# Patient Record
Sex: Male | Born: 1955 | Race: White | Hispanic: No | Smoking: Former smoker
Health system: Southern US, Community
[De-identification: ages and names within clinical notes are randomized; demographics above are authoritative.]

## PROBLEM LIST (undated history)

## (undated) DIAGNOSIS — M1990 Unspecified osteoarthritis, unspecified site: Secondary | ICD-10-CM

## (undated) DIAGNOSIS — M109 Gout, unspecified: Secondary | ICD-10-CM

## (undated) DIAGNOSIS — E119 Type 2 diabetes mellitus without complications: Secondary | ICD-10-CM

## (undated) DIAGNOSIS — T7840XA Allergy, unspecified, initial encounter: Secondary | ICD-10-CM

## (undated) HISTORY — PX: HERNIA REPAIR: SHX51

## (undated) HISTORY — DX: Allergy, unspecified, initial encounter: T78.40XA

## (undated) HISTORY — DX: Unspecified osteoarthritis, unspecified site: M19.90

---

## 2005-03-03 ENCOUNTER — Encounter: Admission: RE | Admit: 2005-03-03 | Discharge: 2005-03-03 | Payer: Self-pay | Admitting: Neurosurgery

## 2006-05-05 ENCOUNTER — Ambulatory Visit: Payer: Self-pay

## 2007-10-23 IMAGING — CR DG SHOULDER 3+V*R*
1 series · 4 of 4 positions shown · non-contrast
Comparison: none

REASON FOR EXAM: Pain
COMMENTS:

PROCEDURE:     DXR - DXR SHOULDER RIGHT COMPLETE  - May 05, 2006 [DATE]
RESULT:        Four views of the RIGHT shoulder show no fracture,
dislocation or other acute bony abnormality.

[Series 1: view not recorded · 0.17mm/px · 4 of 4 slices shown]
[im 1/4]
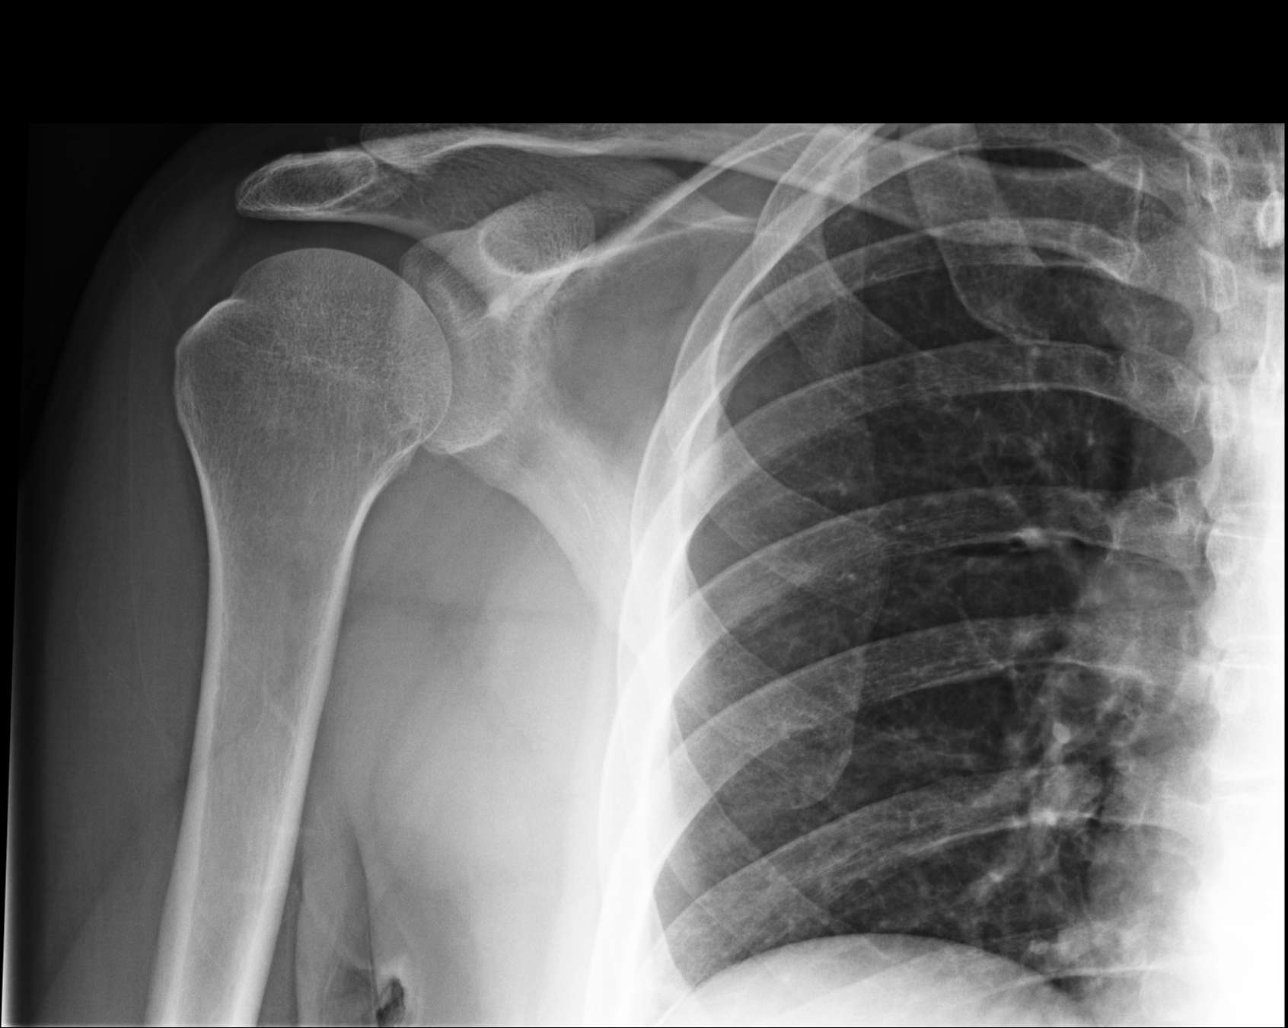
[im 2/4]
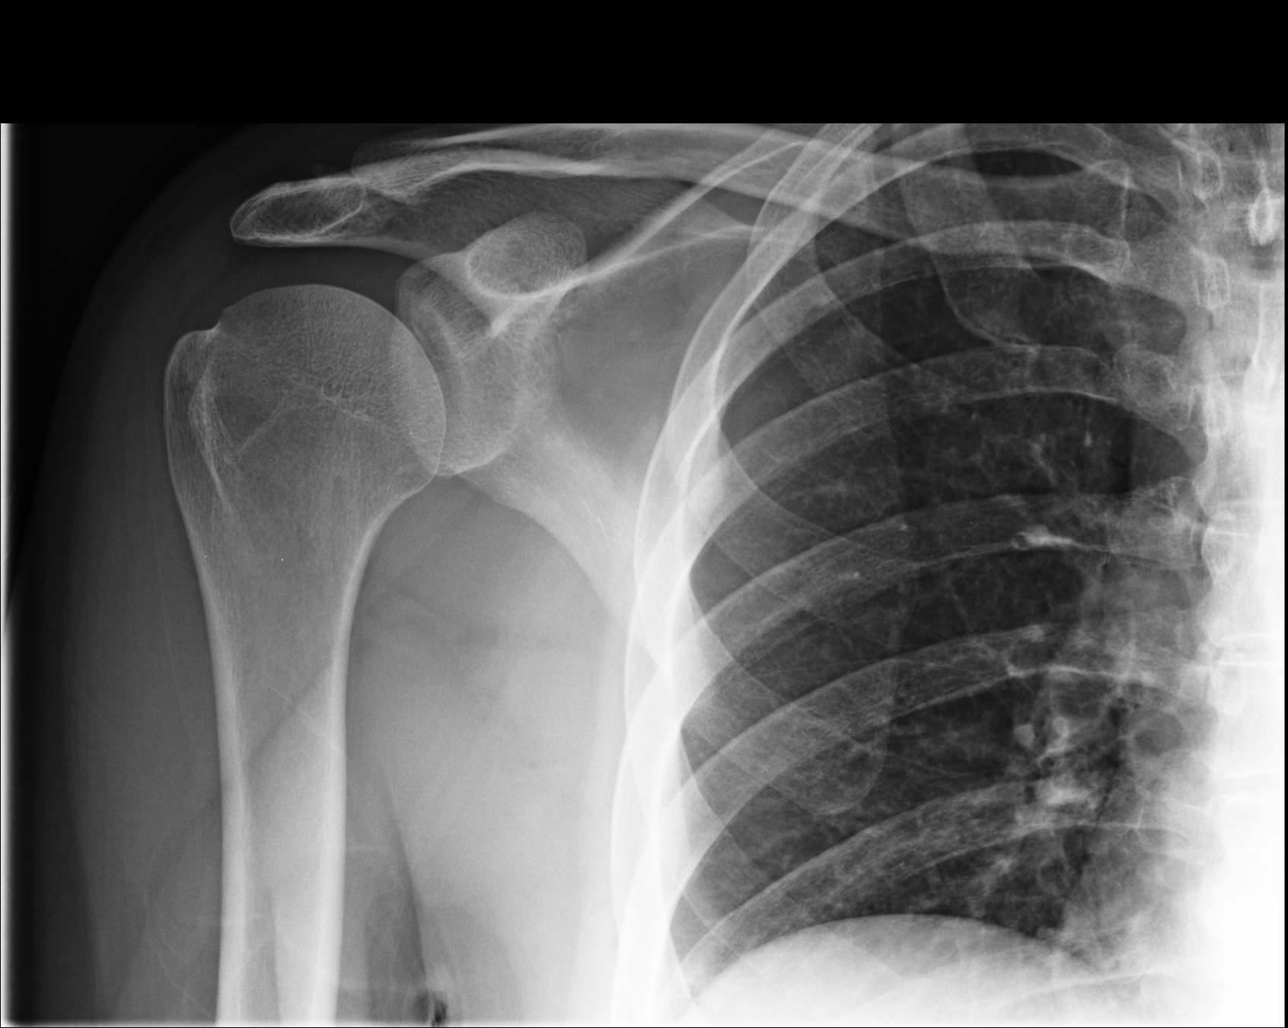
[im 3/4]
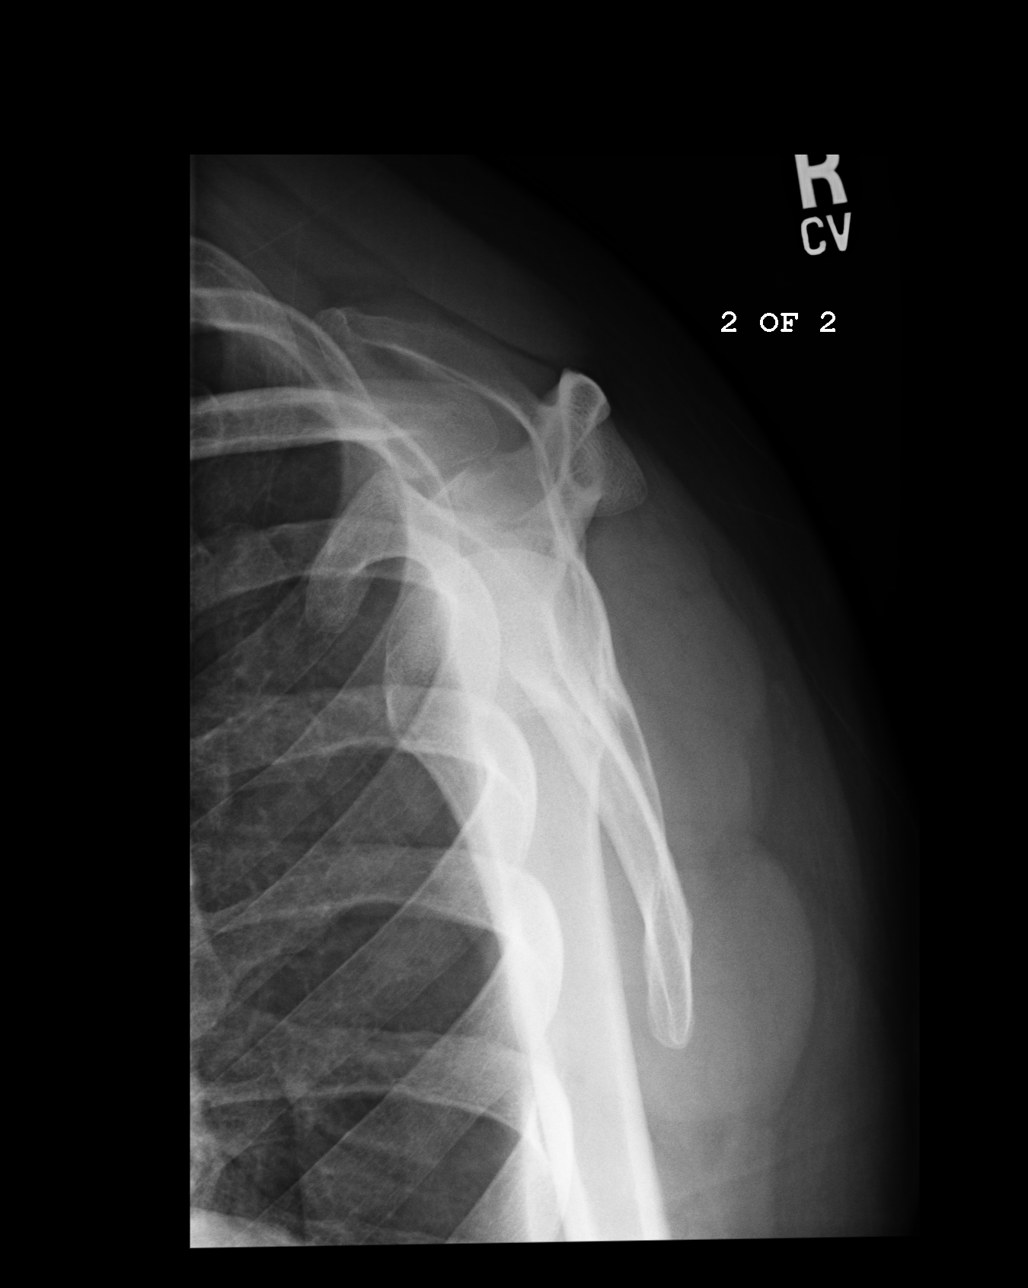
[im 4/4]
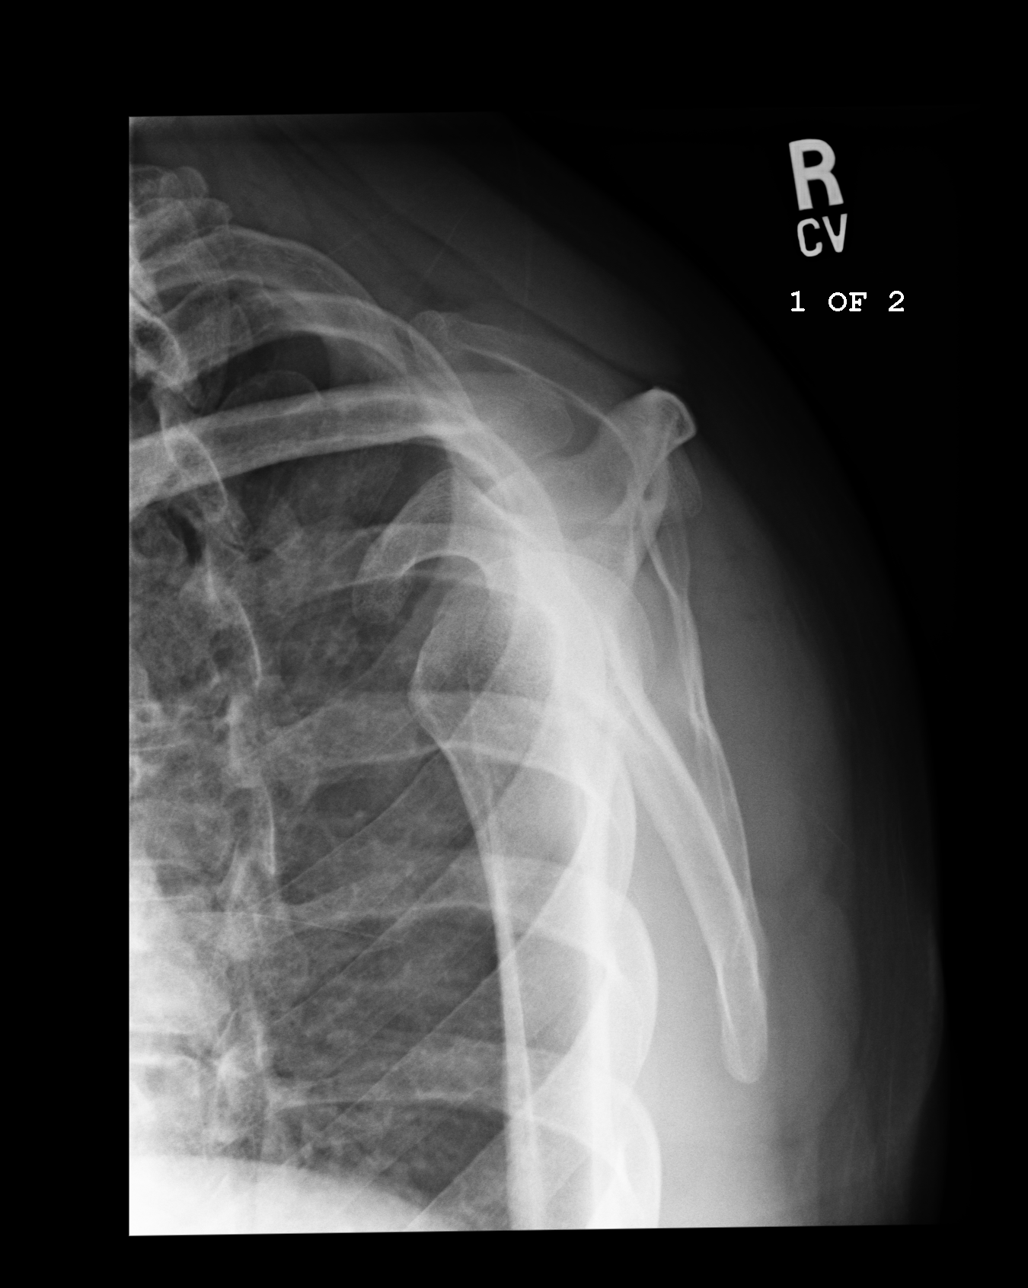

[4 of 4 positions shown; findings below may reference images not displayed]

IMPRESSION: No significant osseous abnormalities are noted.

## 2011-10-16 ENCOUNTER — Ambulatory Visit (INDEPENDENT_AMBULATORY_CARE_PROVIDER_SITE_OTHER): Payer: Medicare Other | Admitting: Family Medicine

## 2011-10-16 DIAGNOSIS — M2550 Pain in unspecified joint: Secondary | ICD-10-CM

## 2011-10-16 DIAGNOSIS — M109 Gout, unspecified: Secondary | ICD-10-CM | POA: Insufficient documentation

## 2011-10-16 MED ORDER — INDOMETHACIN ER 75 MG PO CPCR: 75.0000 mg | ORAL_CAPSULE | Freq: Two times a day (BID) | ORAL | Status: DC
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
## 2011-12-19 ENCOUNTER — Other Ambulatory Visit: Payer: Self-pay | Admitting: Family Medicine

## 2012-06-16 ENCOUNTER — Other Ambulatory Visit: Payer: Self-pay | Admitting: Physician Assistant

## 2013-06-22 ENCOUNTER — Ambulatory Visit: Payer: Medicare Other | Admitting: Family Medicine

## 2013-06-22 VITALS — BP 120/74 | HR 82 | Temp 97.8°F | Resp 18 | Ht 72.0 in | Wt 231.0 lb

## 2013-06-22 DIAGNOSIS — Z0289 Encounter for other administrative examinations: Secondary | ICD-10-CM

## 2013-06-22 DIAGNOSIS — Z0000 Encounter for general adult medical examination without abnormal findings: Secondary | ICD-10-CM
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
## 2013-06-22 NOTE — Progress Notes (Signed)
UA-spgr-1.015,protein-neg,blood-neg,glucose-neg

## 2013-12-28 ENCOUNTER — Other Ambulatory Visit: Payer: Self-pay | Admitting: Physician Assistant

## 2014-01-03 ENCOUNTER — Ambulatory Visit (INDEPENDENT_AMBULATORY_CARE_PROVIDER_SITE_OTHER): Payer: Medicare Other | Admitting: Emergency Medicine

## 2014-01-03 VITALS — BP 116/82 | HR 106 | Temp 98.1°F | Resp 16 | Ht 72.0 in | Wt 232.8 lb

## 2014-01-03 DIAGNOSIS — M109 Gout, unspecified: Secondary | ICD-10-CM

## 2014-01-03 MED ORDER — INDOMETHACIN ER 75 MG PO CPCR: 75.0000 mg | ORAL_CAPSULE | Freq: Two times a day (BID) | ORAL | Status: DC

## 2014-01-03 NOTE — Progress Notes (Signed)
   Subjective:    Patient ID: Frances NickelsGrady K Turgeon, male    DOB: 07/28/1956, 58 y.o.   MRN: [id number]  HPI 58 yo male with gout flare in right great toe.  Onset over several days.  Patient states he missed several doses of allopurinol.  Last flare 7 months ago.  States that when it happens he gets relief with Indomethecin, but is out.  PPMH:  Gout   Review of Systems  Constitutional: Negative for fever and chills.  Respiratory: Negative for choking, chest tightness and shortness of breath.   Musculoskeletal: Positive for arthralgias and joint swelling.       Objective:   Physical Exam Blood pressure 116/82, pulse 106, temperature 98.1 F (36.7 C), temperature source Oral, resp. rate 16, height 6' (1.829 m), weight 232 lb 12.8 oz (105.597 kg), SpO2 96.00%. Body mass index is 31.57 kg/(m^2). Well-developed, well nourished male who is awake, alert and oriented, in NAD. HEENT: [place]/AT, PERRL, EOMI.  Sclera and conjunctiva are clear.  Neck: supple, non-tender, no lymphadenopathy, thyromegaly. Heart: RRR, no murmur Lungs: normal effort, CTA Abdomen: normo-active bowel sounds, supple, non-tender, no mass or organomegaly. Extremities: TTP right 1st MTP joint. Skin: warm and dry without rash. Psychologic: good mood and appropriate affect, normal speech and behavior.        Assessment & Plan:  Gout  Refilled rx for indomethicin.  Patient declined cochicine.

## 2014-01-04 NOTE — Progress Notes (Signed)
Have reviewed note by Dr. McGrath and agree with his plan of care 

## 2014-01-26 ENCOUNTER — Telehealth: Payer: Self-pay

## 2014-01-26 NOTE — Telephone Encounter (Signed)
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 Did not leave call back number.

## 2014-01-31 NOTE — Telephone Encounter (Signed)
Called patient to inform him that DOT form was ready for pick up and to also let him know to sign release form but when I called he said someone from the front office copied it for him already. There is no documentation of this and no release form scanned into Epic.

## 2014-05-17 ENCOUNTER — Other Ambulatory Visit: Payer: Self-pay | Admitting: *Deleted

## 2014-05-17 MED ORDER — INDOMETHACIN ER 75 MG PO CPCR: 75.0000 mg | ORAL_CAPSULE | Freq: Two times a day (BID) | ORAL | Status: AC

## 2014-05-25 ENCOUNTER — Telehealth: Payer: Self-pay | Admitting: *Deleted

## 2014-05-25 NOTE — Telephone Encounter (Signed)
Phoned patient and [facility] to schedule Annual Medicare Wellness Exam at either office main @ or me directly at [phone].

## 2014-06-06 ENCOUNTER — Telehealth: Payer: Self-pay | Admitting: *Deleted

## 2014-06-06 NOTE — Telephone Encounter (Signed)
Phoned & spoke with patient and states his PCP is Dr. Aida PufferJames Little in [place], [place], whom he sees for his routine healthcare needs (updated care mgt).  Further stated that his insurance company [employer]) usually calls him when it is time for his annual exams & schedules them for him.  He is going to call & schedule his appointment with Dr. Clarene DukeLittle or either call his insurance company & clarify.  Patient was not interested in scheduling anything with one of [facility] providers at [facility] at this time.

## 2023-09-23 ENCOUNTER — Telehealth: Payer: Self-pay

## 2023-09-23 NOTE — Telephone Encounter (Signed)
PT requesting call back to schedule colonoscopy [id number]

## 2023-09-24 ENCOUNTER — Other Ambulatory Visit: Payer: Self-pay

## 2023-09-24 ENCOUNTER — Telehealth: Payer: Self-pay

## 2023-09-24 DIAGNOSIS — Z1211 Encounter for screening for malignant neoplasm of colon: Secondary | ICD-10-CM

## 2023-09-24 MED ORDER — NA SULFATE-K SULFATE-MG SULF 17.5-3.13-1.6 GM/177ML PO SOLN: 1.0000 | Freq: Once | ORAL | 0 refills | Status: AC

## 2023-09-24 NOTE — Telephone Encounter (Signed)
Gastroenterology Pre-Procedure Review  Request Date: 10/09/23 Requesting Physician: Dr. Tobi Bastos  PATIENT REVIEW QUESTIONS: The patient responded to the following health history questions as indicated:    1. Are you having any GI issues? no 2. Do you have a personal history of Polyps? yes (maybe 5-6 years ago at [facility]) 3. Do you have a family history of Colon Cancer or Polyps? no 4. Diabetes Mellitus? yes (Jardiance has been advised to stop 3 days prior to procedure) 5. Joint replacements in the past 12 months?no 6. Major health problems in the past 3 months?no 7. Any artificial heart valves, MVP, or defibrillator?no    MEDICATIONS & ALLERGIES:    Patient reports the following regarding taking any anticoagulation/antiplatelet therapy:   Plavix, Coumadin, Eliquis, Xarelto, Lovenox, Pradaxa, Brilinta, or Effient? no Aspirin? no  Patient confirms/reports the following medications:  Current Outpatient Medications  Medication Sig Dispense Refill   allopurinol (ZYLOPRIM) 300 MG tablet Take 300 mg by mouth daily.     indomethacin (INDOCIN SR) 75 MG CR capsule Take 1 capsule (75 mg total) by mouth 2 (two) times daily with a meal. 30 capsule 0   No current facility-administered medications for this visit.    Patient confirms/reports the following allergies:  Allergies  Allergen Reactions   Valium Other (See Comments)    Depression   Vytorin [Ezetimibe-Simvastatin] Rash    No orders of the defined types were placed in this encounter.   AUTHORIZATION INFORMATION Primary Insurance: 1D#: Group #:  Secondary Insurance: 1D#: Group #:  SCHEDULE INFORMATION: Date: 10/09/23 Time: Location: [facility]

## 2023-10-02 ENCOUNTER — Encounter: Payer: Self-pay | Admitting: Gastroenterology

## 2023-10-06 ENCOUNTER — Telehealth: Payer: Self-pay

## 2023-10-06 NOTE — Telephone Encounter (Signed)
The patient called in because he needs his prep. He said that [facility] tried to charge him $75 and he said that he didn't pay because he get his medicine free through the [place].

## 2023-10-09 ENCOUNTER — Ambulatory Visit: Payer: No Typology Code available for payment source | Admitting: General Practice

## 2023-10-09 ENCOUNTER — Ambulatory Visit
Admission: RE | Admit: 2023-10-09 | Discharge: 2023-10-09 | Disposition: A | Discharge status: Home / Self Care | Payer: No Typology Code available for payment source | Attending: Gastroenterology | Admitting: Gastroenterology

## 2023-10-09 ENCOUNTER — Other Ambulatory Visit: Payer: Self-pay

## 2023-10-09 ENCOUNTER — Encounter
Admission: RE | Disposition: A | Discharge status: Home / Self Care | Payer: Self-pay | Source: Home / Self Care | Attending: Gastroenterology

## 2023-10-09 ENCOUNTER — Encounter: Payer: Self-pay | Admitting: Gastroenterology

## 2023-10-09 DIAGNOSIS — Z1211 Encounter for screening for malignant neoplasm of colon: Secondary | ICD-10-CM

## 2023-10-09 DIAGNOSIS — E119 Type 2 diabetes mellitus without complications: Secondary | ICD-10-CM | POA: Insufficient documentation

## 2023-10-09 DIAGNOSIS — Z860100 Personal history of colon polyps, unspecified: Secondary | ICD-10-CM | POA: Diagnosis not present

## 2023-10-09 DIAGNOSIS — Z87891 Personal history of nicotine dependence: Secondary | ICD-10-CM | POA: Insufficient documentation

## 2023-10-09 DIAGNOSIS — Z7984 Long term (current) use of oral hypoglycemic drugs: Secondary | ICD-10-CM | POA: Insufficient documentation

## 2023-10-09 HISTORY — PX: COLONOSCOPY WITH PROPOFOL: SHX5780

## 2023-10-09 LAB — GLUCOSE, CAPILLARY: Glucose-Capillary: 143 mg/dL — ABNORMAL HIGH (ref 70–99)

## 2023-10-09 SURGERY — COLONOSCOPY WITH PROPOFOL
Anesthesia: General

## 2023-10-09 MED ORDER — SODIUM CHLORIDE 0.9% FLUSH: 3.0000 mL | Freq: Two times a day (BID) | INTRAVENOUS | Status: DC

## 2023-10-09 MED ORDER — SODIUM CHLORIDE 0.9% FLUSH: 3.0000 mL | INTRAVENOUS | Status: DC | PRN

## 2023-10-09 MED ORDER — PROPOFOL 10 MG/ML IV BOLUS
INTRAVENOUS | Status: DC | PRN
  Administered 2023-10-09: 80 mg via INTRAVENOUS

## 2023-10-09 MED ORDER — SODIUM CHLORIDE 0.9 % IV SOLN: INTRAVENOUS | Status: DC | PRN

## 2023-10-09 MED ORDER — PROPOFOL 10 MG/ML IV BOLUS: INTRAVENOUS | Status: AC

## 2023-10-09 NOTE — H&P (Signed)
 Ruel Kung, MD [address], [place], [place], [address] [address], [place], [place], [address] Phone: [phone]  Fax: [phone]  Primary Care Physician:  Milissa Savant, MD   Pre-Procedure History & Physical: HPI:  Andrew Singh is a 68 y.o. male is here for an colonoscopy.   Past Medical History:  Diagnosis Date   Allergy    Arthritis     Past Surgical History:  Procedure Laterality Date   HERNIA REPAIR      Prior to Admission medications   Medication Sig Start Date End Date Taking? Authorizing Provider  allopurinol (ZYLOPRIM) 300 MG tablet Take 300 mg by mouth daily.   Yes [provider]  cholecalciferol (VITAMIN D3) 25 MCG (1000 UNIT) tablet Take 1,000 Units by mouth daily.   Yes [provider]  empagliflozin (JARDIANCE) 25 MG TABS tablet Take 25 mg by mouth daily. Taking 1/2 daily   Yes [provider]  Multiple Vitamins-Minerals (CENTRUM SILVER PO) Take by mouth.   Yes [provider]  indomethacin  (INDOCIN  SR) 75 MG CR capsule Take 1 capsule (75 mg total) by mouth 2 (two) times daily with a meal. Patient not taking: Reported on 09/24/2023 05/17/14   Harvey Seltzer, MD    Allergies as of 09/24/2023 - Review Complete 09/24/2023  Allergen Reaction Noted   Valium Other (See Comments) 10/16/2011   Vytorin [ezetimibe-simvastatin] Rash 06/22/2013    Family History  Problem Relation Age of Onset   Cancer Father    Heart disease Maternal Grandfather     Social History   Socioeconomic History   Marital status: Divorced    Spouse name: Not on file   Number of children: Not on file   Years of education: Not on file   Highest education level: Not on file  Occupational History   Not on file  Tobacco Use   Smoking status: Former   Smokeless tobacco: Not on file  Substance and Sexual Activity   Alcohol use: Not on file   Drug use: Not on file   Sexual activity: Not on file  Other Topics Concern    Not on file  Social History Narrative   Not on file   Social Drivers of Health   Financial Resource Strain: Not on file  Food Insecurity: Not on file  Transportation Needs: Not on file  Physical Activity: Not on file  Stress: Not on file  Social Connections: Not on file  Intimate Partner Violence: Not on file    Review of Systems: See HPI, otherwise negative ROS  Physical Exam: BP 114/75   Pulse 68   Temp (!) 97 F (36.1 C) (Temporal)   Resp 16   Ht 6' (1.829 m)   Wt 92.6 kg   SpO2 100%   BMI 27.69 kg/m  General:   Alert,  pleasant and cooperative in NAD Head:  Normocephalic and atraumatic. Neck:  Supple; no masses or thyromegaly. Lungs:  Clear throughout to auscultation, normal respiratory effort.    Heart:  +S1, +S2, Regular rate and rhythm, No edema. Abdomen:  Soft, nontender and nondistended. Normal bowel sounds, without guarding, and without rebound.   Neurologic:  Alert and  oriented x4;  grossly normal neurologically.  Impression/Plan: Andrew Singh is here for an colonoscopy to be performed for surveillance due to prior history of colon polyps   Risks, benefits, limitations, and alternatives regarding  colonoscopy have been reviewed with the patient.  Questions have been answered.  All  parties agreeable.   Ruel Kung, MD  10/09/2023, 7:41 AM

## 2023-10-09 NOTE — Op Note (Signed)
 [facility] Gastroenterology Patient Name: Andrew Singh Procedure Date: 10/09/2023 7:18 AM MRN: [id number] Account #: [id number] Date of Birth: 12/27/1955 Admit Type: Outpatient Age: 68 Room: [facility] ENDO ROOM 4 Gender: Male Note Status: Finalized Instrument Name: Arvis [id number] Procedure:             Colonoscopy Indications:           Surveillance: Personal history of colonic polyps                         (unknown histology) on last colonoscopy more than 3                         years ago Providers:             Ruel Kung MD, MD Referring MD:          Jayson Ill (Referring MD) Medicines:             Monitored Anesthesia Care Complications:         No immediate complications. Procedure:             Pre-Anesthesia Assessment:                        - Prior to the procedure, a History and Physical was                         performed, and patient medications, allergies and                         sensitivities were reviewed. The patient's tolerance                         of previous anesthesia was reviewed.                        - The risks and benefits of the procedure and the                         sedation options and risks were discussed with the                         patient. All questions were answered and informed                         consent was obtained.                        - ASA Grade Assessment: II - A patient with mild                         systemic disease.                        After obtaining informed consent, the colonoscope was                         passed under direct vision. Throughout the procedure,                         the patient's blood pressure, pulse,  and oxygen                         saturations were monitored continuously. The                         Colonoscope was introduced through the anus with the                         intention of advancing to the cecum. The scope was                         advanced to  the descending colon before the procedure                         was aborted. Medications were given. The colonoscopy                         was performed with ease. The patient tolerated the                         procedure well. The quality of the bowel preparation                         was inadequate. Findings:      The perianal and digital rectal examinations were normal.      A large amount of stool was found in the entire colon, interfering with       visualization. Impression:            - Preparation of the colon was inadequate.                        - Stool in the entire examined colon.                        - No specimens collected. Recommendation:        - Discharge patient to home (with escort).                        - Resume previous diet.                        - Continue present medications.                        - Repeat colonoscopy in 2 weeks because the bowel                         preparation was suboptimal. Procedure Code(s):     --- Professional ---                        [phone], 53, Colonoscopy, flexible; diagnostic,                         including collection of specimen(s) by brushing or                         washing, when performed (separate procedure) Diagnosis Code(s):     --- Professional ---  Z86.010, Personal history of colonic polyps CPT copyright 2022 American Medical Association. All rights reserved. The codes documented in this report are preliminary and upon coder review may  be revised to meet current compliance requirements. Ruel Kung, MD Ruel Kung MD, MD 10/09/2023 7:49:17 AM This report has been signed electronically. Number of Addenda: 0 Note Initiated On: 10/09/2023 7:18 AM Total Procedure Duration: 0 hours 1 minute 48 seconds  Estimated Blood Loss:  Estimated blood loss: none.      [facility]

## 2023-10-09 NOTE — Anesthesia Postprocedure Evaluation (Signed)
 Anesthesia Post Note  Patient: Andrew Singh  Procedure(s) Performed: COLONOSCOPY WITH PROPOFOL   Patient location during evaluation: PACU Anesthesia Type: General Level of consciousness: awake and alert Pain management: pain level controlled Vital Signs Assessment: post-procedure vital signs reviewed and stable Respiratory status: spontaneous breathing, nonlabored ventilation, respiratory function stable and patient connected to nasal cannula oxygen Cardiovascular status: blood pressure returned to baseline and stable Postop Assessment: no apparent nausea or vomiting Anesthetic complications: no  No notable events documented.   Last Vitals:  Vitals:   10/09/23 0752 10/09/23 0805  BP: (!) 97/53 114/87  Pulse: 64 62  Resp: 17 10  Temp: (!) 35.6 C   SpO2: 98% 97%    Last Pain:  Vitals:   10/09/23 0805  TempSrc:   PainSc: 0-No pain                 Debby Mines

## 2023-10-09 NOTE — Anesthesia Preprocedure Evaluation (Signed)
 Anesthesia Evaluation  Patient identified by MRN, date of birth, ID band Patient awake    Reviewed: Allergy & Precautions, NPO status , Patient's Chart, lab work & pertinent test results  Airway Mallampati: III  TM Distance: >3 FB Neck ROM: full    Dental  (+) Chipped   Pulmonary neg pulmonary ROS, former smoker   Pulmonary exam normal        Cardiovascular negative cardio ROS Normal cardiovascular exam     Neuro/Psych negative neurological ROS  negative psych ROS   GI/Hepatic negative GI ROS, Neg liver ROS,,,  Endo/Other  diabetes    Renal/GU negative Renal ROS  negative genitourinary   Musculoskeletal   Abdominal   Peds  Hematology negative hematology ROS (+)   Anesthesia Other Findings Past Medical History: No date: Allergy No date: Arthritis  Past Surgical History: No date: HERNIA REPAIR  BMI    Body Mass Index: 27.69 kg/m      Reproductive/Obstetrics negative OB ROS                             Anesthesia Physical Anesthesia Plan  ASA: 2  Anesthesia Plan: General   Post-op Pain Management: Minimal or no pain anticipated   Induction: Intravenous  PONV Risk Score and Plan: 3 and Propofol  infusion, TIVA and Ondansetron  Airway Management Planned: Nasal Cannula  Additional Equipment: None  Intra-op Plan:   Post-operative Plan:   Informed Consent: I have reviewed the patients History and Physical, chart, labs and discussed the procedure including the risks, benefits and alternatives for the proposed anesthesia with the patient or authorized representative who has indicated his/her understanding and acceptance.     Dental advisory given  Plan Discussed with: CRNA and Surgeon  Anesthesia Plan Comments: (Discussed risks of anesthesia with patient, including possibility of difficulty with spontaneous ventilation under anesthesia necessitating airway intervention,  PONV, and rare risks such as cardiac or respiratory or neurological events, and allergic reactions. Discussed the role of CRNA in patient's perioperative care. Patient understands.)       Anesthesia Quick Evaluation

## 2023-10-09 NOTE — Transfer of Care (Signed)
 Immediate Anesthesia Transfer of Care Note  Patient: [name]  Procedure(s) Performed: COLONOSCOPY WITH PROPOFOL   Patient Location: PACU  Anesthesia Type:MAC  Level of Consciousness: awake  Airway & Oxygen Therapy: Patient Spontanous Breathing  Post-op Assessment: Report given to RN and Post -op Vital signs reviewed and stable  Post vital signs: Reviewed and stable  Last Vitals:  Vitals Value Taken Time  BP 97/53 10/09/23 0753  Temp 35.6 C 10/09/23 0752  Pulse 66 10/09/23 0754  Resp 15 10/09/23 0754  SpO2 98 % 10/09/23 0754  Vitals shown include unfiled device data.  Last Pain:  Vitals:   10/09/23 0752  TempSrc: Temporal  PainSc: 0-No pain      Patients Stated Pain Goal: 0 (10/09/23 0703)  Complications: No notable events documented.

## 2023-10-12 ENCOUNTER — Encounter: Payer: Self-pay | Admitting: Gastroenterology

## 2023-10-13 ENCOUNTER — Telehealth: Payer: Self-pay

## 2023-10-13 NOTE — Telephone Encounter (Signed)
[facility] is requesting [place] to fax them the colonoscopy report and path report. Printed both reports and faxed them to [phone]

## 2023-10-14 NOTE — Telephone Encounter (Signed)
Andrew Singh stated that he is to wait for the [facility] to approve another colonoscopy. Patient was also upset because he did not receive Sutabs in addition to the liquid prep. Patient stated that he was going to wait for the [facility] before he calls and reschedule a colonoscopy.

## 2024-03-10 ENCOUNTER — Telehealth: Payer: Self-pay

## 2024-03-10 NOTE — Telephone Encounter (Signed)
 Pt had previous referral for colonoscopy from 10/2023, pt was to schedule a repeat colonoscopy... No one let you know as the previous referral expired in May... Can you please obtain another auth to repeat colonoscopy for the pt?

## 2024-03-15 NOTE — Telephone Encounter (Signed)
 Received a call from the [facility] department requesting a new request for the colonoscopy that needs repeating. Stated you could put TBD on request. Please fax or email to:  Fax: [phone] or Email: [email or web address]  They are waiting on the request for approval.

## 2024-03-17 ENCOUNTER — Other Ambulatory Visit: Payer: Self-pay

## 2024-03-17 DIAGNOSIS — G4733 Obstructive sleep apnea (adult) (pediatric): Secondary | ICD-10-CM | POA: Insufficient documentation

## 2024-03-17 DIAGNOSIS — E559 Vitamin D deficiency, unspecified: Secondary | ICD-10-CM | POA: Insufficient documentation

## 2024-03-17 DIAGNOSIS — L659 Nonscarring hair loss, unspecified: Secondary | ICD-10-CM | POA: Insufficient documentation

## 2024-03-17 DIAGNOSIS — F419 Anxiety disorder, unspecified: Secondary | ICD-10-CM | POA: Insufficient documentation

## 2024-03-17 DIAGNOSIS — K219 Gastro-esophageal reflux disease without esophagitis: Secondary | ICD-10-CM | POA: Insufficient documentation

## 2024-03-17 DIAGNOSIS — E119 Type 2 diabetes mellitus without complications: Secondary | ICD-10-CM | POA: Insufficient documentation

## 2024-03-17 DIAGNOSIS — Z8739 Personal history of other diseases of the musculoskeletal system and connective tissue: Secondary | ICD-10-CM | POA: Insufficient documentation

## 2024-03-17 DIAGNOSIS — F32A Depression, unspecified: Secondary | ICD-10-CM | POA: Insufficient documentation

## 2024-03-24 ENCOUNTER — Other Ambulatory Visit: Payer: Self-pay

## 2024-03-24 ENCOUNTER — Telehealth: Payer: Self-pay

## 2024-03-24 DIAGNOSIS — Z1211 Encounter for screening for malignant neoplasm of colon: Secondary | ICD-10-CM

## 2024-03-24 DIAGNOSIS — Z860100 Personal history of colon polyps, unspecified: Secondary | ICD-10-CM

## 2024-03-24 MED ORDER — PEG 3350-KCL-NA BICARB-NACL 420 G PO SOLR: 4000.0000 mL | ORAL | 0 refills | Status: AC

## 2024-03-24 NOTE — Telephone Encounter (Signed)
 Pt lmovm requesting call back to schedule repeat colonoscopy

## 2024-03-24 NOTE — Telephone Encounter (Signed)
 Gastroenterology Pre-Procedure Review  Request Date: 06/21/24 Requesting Physician: Dr. Jinny  PATIENT REVIEW QUESTIONS: The patient responded to the following health history questions as indicated:    1. Are you having any GI issues? no 2. Do you have a personal history of Polyps? yes (over 5 years ago [facility], previous colonoscopy attempted 10/09/23 but poor prep.  Prep changed from Suprep to (2) days Nulytely ) 3. Do you have a family history of Colon Cancer or Polyps? no 4. Diabetes Mellitus? yes (pt takes Jardiance and has been advised to stop 3 days prior to procedure and if he has any concerns regarding his blood sugars to contact PCP) 5. Joint replacements in the past 12 months?no 6. Major health problems in the past 3 months?no 7. Any artificial heart valves, MVP, or defibrillator?no    MEDICATIONS & ALLERGIES:    Patient reports the following regarding taking any anticoagulation/antiplatelet therapy:   Plavix, Coumadin, Eliquis, Xarelto, Lovenox, Pradaxa, Brilinta, or Effient? no Aspirin? no  Patient confirms/reports the following medications:  Current Outpatient Medications  Medication Sig Dispense Refill   allopurinol (ZYLOPRIM) 300 MG tablet Take 300 mg by mouth daily.     cholecalciferol (VITAMIN D3) 25 MCG (1000 UNIT) tablet Take 1,000 Units by mouth daily.     empagliflozin (JARDIANCE) 25 MG TABS tablet Take 25 mg by mouth daily. Taking 1/2 daily     polyethylene glycol-electrolytes (NULYTELY) 420 g solution Take 4,000 mLs by mouth 1 day or 1 dose for 2 days. 8000 mL 0   atorvastatin (LIPITOR) 20 MG tablet Take 20 mg by mouth daily. (Patient not taking: Reported on 03/24/2024)     indomethacin  (INDOCIN  SR) 75 MG CR capsule Take 1 capsule (75 mg total) by mouth 2 (two) times daily with a meal. (Patient not taking: Reported on 09/24/2023) 30 capsule 0   Multiple Vitamins-Minerals (CENTRUM SILVER PO) Take by mouth.     No current facility-administered medications for this  visit.    Patient confirms/reports the following allergies:  Allergies  Allergen Reactions   Valium Other (See Comments)    Depression   Vytorin [Ezetimibe-Simvastatin] Rash    No orders of the defined types were placed in this encounter.   AUTHORIZATION INFORMATION Primary Insurance: 1D#: Group #:  Secondary Insurance: 1D#: Group #:  SCHEDULE INFORMATION: Date: 06/21/24 Time: Location: [facility]

## 2024-06-21 ENCOUNTER — Encounter: Payer: Self-pay | Admitting: Gastroenterology

## 2024-06-21 ENCOUNTER — Ambulatory Visit: Admitting: Certified Registered"

## 2024-06-21 ENCOUNTER — Ambulatory Visit
Admission: RE | Admit: 2024-06-21 | Discharge: 2024-06-21 | Disposition: A | Discharge status: Home / Self Care | Attending: Gastroenterology | Admitting: Gastroenterology

## 2024-06-21 ENCOUNTER — Encounter
Admission: RE | Disposition: A | Discharge status: Home / Self Care | Payer: Self-pay | Source: Home / Self Care | Attending: Gastroenterology

## 2024-06-21 DIAGNOSIS — E119 Type 2 diabetes mellitus without complications: Secondary | ICD-10-CM | POA: Insufficient documentation

## 2024-06-21 DIAGNOSIS — Z87891 Personal history of nicotine dependence: Secondary | ICD-10-CM | POA: Insufficient documentation

## 2024-06-21 DIAGNOSIS — F32A Depression, unspecified: Secondary | ICD-10-CM | POA: Diagnosis not present

## 2024-06-21 DIAGNOSIS — Z1211 Encounter for screening for malignant neoplasm of colon: Secondary | ICD-10-CM | POA: Insufficient documentation

## 2024-06-21 DIAGNOSIS — F419 Anxiety disorder, unspecified: Secondary | ICD-10-CM | POA: Diagnosis not present

## 2024-06-21 DIAGNOSIS — G4730 Sleep apnea, unspecified: Secondary | ICD-10-CM | POA: Insufficient documentation

## 2024-06-21 DIAGNOSIS — Z79899 Other long term (current) drug therapy: Secondary | ICD-10-CM | POA: Diagnosis not present

## 2024-06-21 DIAGNOSIS — K219 Gastro-esophageal reflux disease without esophagitis: Secondary | ICD-10-CM | POA: Diagnosis not present

## 2024-06-21 DIAGNOSIS — Z860101 Personal history of adenomatous and serrated colon polyps: Secondary | ICD-10-CM | POA: Insufficient documentation

## 2024-06-21 DIAGNOSIS — Z860100 Personal history of colon polyps, unspecified: Secondary | ICD-10-CM

## 2024-06-21 HISTORY — DX: Gout, unspecified: M10.9

## 2024-06-21 HISTORY — PX: COLONOSCOPY: SHX5424

## 2024-06-21 HISTORY — DX: Type 2 diabetes mellitus without complications: E11.9

## 2024-06-21 LAB — GLUCOSE, CAPILLARY: Glucose-Capillary: 150 mg/dL — ABNORMAL HIGH (ref 70–99)

## 2024-06-21 SURGERY — COLONOSCOPY
Anesthesia: General

## 2024-06-21 MED ORDER — PROPOFOL 10 MG/ML IV BOLUS
INTRAVENOUS | Status: DC | PRN
  Administered 2024-06-21: 80 mg via INTRAVENOUS

## 2024-06-21 MED ORDER — PROPOFOL 1000 MG/100ML IV EMUL
INTRAVENOUS | Status: AC
  Filled 2024-06-21: qty 100

## 2024-06-21 MED ORDER — PROPOFOL 500 MG/50ML IV EMUL
INTRAVENOUS | Status: DC | PRN
  Administered 2024-06-21: 125 ug/kg/min via INTRAVENOUS

## 2024-06-21 MED ORDER — PHENYLEPHRINE 80 MCG/ML (10ML) SYRINGE FOR IV PUSH (FOR BLOOD PRESSURE SUPPORT)
PREFILLED_SYRINGE | INTRAVENOUS | Status: DC | PRN
  Administered 2024-06-21 (×2): 80 ug via INTRAVENOUS

## 2024-06-21 MED ORDER — SODIUM CHLORIDE 0.9 % IV SOLN: INTRAVENOUS | Status: DC

## 2024-06-21 MED ORDER — LIDOCAINE HCL (CARDIAC) PF 100 MG/5ML IV SOSY
PREFILLED_SYRINGE | INTRAVENOUS | Status: DC | PRN
  Administered 2024-06-21: 100 mg via INTRAVENOUS

## 2024-06-21 NOTE — H&P (Signed)
 Rogelia Copping, MD [facility] [address], [address] [place], [place] [address] [phone] Fax : [phone]  Primary Care Physician:  Milissa Savant, MD Primary Gastroenterologist:  Dr. Copping  Pre-Procedure History & Physical: HPI:  Andrew Singh is a 68 y.o. male is here for an colonoscopy.   Past Medical History:  Diagnosis Date   Allergy    Arthritis    Diabetes mellitus without complication (HCC)    Gout     Past Surgical History:  Procedure Laterality Date   COLONOSCOPY WITH PROPOFOL  N/A 10/09/2023   Procedure: COLONOSCOPY WITH PROPOFOL ;  Surgeon: Therisa Bi, MD;  Location: [facility] ENDOSCOPY;  Service: Gastroenterology;  Laterality: N/A;   HERNIA REPAIR      Prior to Admission medications   Medication Sig Start Date End Date Taking? Authorizing Provider  allopurinol (ZYLOPRIM) 300 MG tablet Take 300 mg by mouth daily.   Yes [provider]  atorvastatin (LIPITOR) 20 MG tablet Take 20 mg by mouth daily. Patient not taking: Reported on 03/24/2024 09/10/23   [provider]  cholecalciferol (VITAMIN D3) 25 MCG (1000 UNIT) tablet Take 1,000 Units by mouth daily.    [provider]  empagliflozin (JARDIANCE) 25 MG TABS tablet Take 25 mg by mouth daily. Taking 1/2 daily    [provider]  indomethacin  (INDOCIN  SR) 75 MG CR capsule Take 1 capsule (75 mg total) by mouth 2 (two) times daily with a meal. Patient not taking: Reported on 09/24/2023 05/17/14   Harvey Seltzer, MD  Multiple Vitamins-Minerals (CENTRUM SILVER PO) Take by mouth.    [provider]    Allergies as of 03/24/2024 - Review Complete 03/24/2024  Allergen Reaction Noted   Valium Other (See Comments) 10/16/2011   Vytorin [ezetimibe-simvastatin] Rash 06/22/2013    Family History  Problem Relation Age of Onset   Cancer Father    Heart disease Maternal Grandfather     Social History   Socioeconomic History   Marital status: Divorced    Spouse name: Not on file    Number of children: Not on file   Years of education: Not on file   Highest education level: Not on file  Occupational History   Not on file  Tobacco Use   Smoking status: Former   Smokeless tobacco: Not on file  Vaping Use   Vaping status: Never Used  Substance and Sexual Activity   Alcohol use: Yes    Comment: rarely   Drug use: Not Currently   Sexual activity: Not on file  Other Topics Concern   Not on file  Social History Narrative   Not on file   Social Drivers of Health   Financial Resource Strain: Not on file  Food Insecurity: Not on file  Transportation Needs: Not on file  Physical Activity: Not on file  Stress: Not on file  Social Connections: Not on file  Intimate Partner Violence: Not on file    Review of Systems: See HPI, otherwise negative ROS  Physical Exam: BP (!) 146/90   Pulse 65   Temp (!) 97 F (36.1 C) (Temporal)   Resp 20   Ht 6' (1.829 m)   Wt 95.3 kg   SpO2 99%   BMI 28.48 kg/m  General:   Alert,  pleasant and cooperative in NAD Head:  Normocephalic and atraumatic. Neck:  Supple; no masses or thyromegaly. Lungs:  Clear throughout to auscultation.    Heart:  Regular rate and rhythm. Abdomen:  Soft, nontender and nondistended. Normal  bowel sounds, without guarding, and without rebound.   Neurologic:  Alert and  oriented x4;  grossly normal neurologically.  Impression/Plan: Andrew Singh is here for an colonoscopy to be performed for a history of adenomatous polyps on 2025   Risks, benefits, limitations, and alternatives regarding  colonoscopy have been reviewed with the patient.  Questions have been answered.  All parties agreeable.   Rogelia Copping, MD  06/21/2024, 9:59 AM

## 2024-06-21 NOTE — Transfer of Care (Signed)
 Immediate Anesthesia Transfer of Care Note  Patient: Andrew Singh  Procedure(s) Performed: COLONOSCOPY  Patient Location: PACU  Anesthesia Type:MAC  Level of Consciousness: awake, alert , and oriented  Airway & Oxygen Therapy: Patient Spontanous Breathing  Post-op Assessment: Report given to RN and Post -op Vital signs reviewed and stable  Post vital signs: stable  Last Vitals:  Vitals Value Taken Time  BP 92/49 06/21/24 10:43  Temp    Pulse 75 06/21/24 10:43  Resp 12 06/21/24 10:43  SpO2 98 % 06/21/24 10:43  Vitals shown include unfiled device data.  Last Pain:  Vitals:   06/21/24 0944  TempSrc: Temporal  PainSc: 0-No pain         Complications: No notable events documented.

## 2024-06-21 NOTE — Anesthesia Preprocedure Evaluation (Signed)
 Anesthesia Evaluation  Patient identified by MRN, date of birth, ID band Patient awake    Reviewed: Allergy & Precautions, H&P , NPO status , Patient's Chart, lab work & pertinent test results, reviewed documented beta blocker date and time   Airway Mallampati: II   Neck ROM: full    Dental  (+) Poor Dentition   Pulmonary neg pulmonary ROS, sleep apnea , former smoker   Pulmonary exam normal        Cardiovascular negative cardio ROS Normal cardiovascular exam Rhythm:regular Rate:Normal     Neuro/Psych  PSYCHIATRIC DISORDERS Anxiety Depression    negative neurological ROS  negative psych ROS   GI/Hepatic negative GI ROS, Neg liver ROS,GERD  ,,  Endo/Other  negative endocrine ROSdiabetes    Renal/GU negative Renal ROS  negative genitourinary   Musculoskeletal  (+) Arthritis ,    Abdominal   Peds  Hematology negative hematology ROS (+)   Anesthesia Other Findings Past Medical History: No date: Allergy No date: Arthritis No date: Diabetes mellitus without complication (HCC) No date: Gout Past Surgical History: 10/09/2023: COLONOSCOPY WITH PROPOFOL ; N/A     Comment:  Procedure: COLONOSCOPY WITH PROPOFOL ;  Surgeon: Therisa Bi, MD;  Location: [facility] ENDOSCOPY;  Service:               Gastroenterology;  Laterality: N/A; No date: HERNIA REPAIR BMI    Body Mass Index: 28.48 kg/m     Reproductive/Obstetrics negative OB ROS                              Anesthesia Physical Anesthesia Plan  ASA: 2  Anesthesia Plan: General   Post-op Pain Management:    Induction:   PONV Risk Score and Plan:   Airway Management Planned:   Additional Equipment:   Intra-op Plan:   Post-operative Plan:   Informed Consent: I have reviewed the patients History and Physical, chart, labs and discussed the procedure including the risks, benefits and alternatives for the proposed anesthesia  with the patient or authorized representative who has indicated his/her understanding and acceptance.     Dental Advisory Given  Plan Discussed with: CRNA  Anesthesia Plan Comments:         Anesthesia Quick Evaluation

## 2024-06-21 NOTE — Anesthesia Postprocedure Evaluation (Signed)
 Anesthesia Post Note  Patient: Andrew Singh  Procedure(s) Performed: COLONOSCOPY  Patient location during evaluation: PACU Anesthesia Type: General Level of consciousness: awake and alert Pain management: pain level controlled Vital Signs Assessment: post-procedure vital signs reviewed and stable Respiratory status: spontaneous breathing, nonlabored ventilation, respiratory function stable and patient connected to nasal cannula oxygen Cardiovascular status: blood pressure returned to baseline and stable Postop Assessment: no apparent nausea or vomiting Anesthetic complications: no   No notable events documented.   Last Vitals:  Vitals:   06/21/24 1052 06/21/24 1102  BP:  117/69  Pulse: 61 63  Resp: 12 11  Temp:    SpO2: 97% 99%    Last Pain:  Vitals:   06/21/24 1102  TempSrc:   PainSc: 0-No pain                 Lynwood [name] Clause

## 2024-06-21 NOTE — Op Note (Signed)
 [facility] Gastroenterology Patient Name: Andrew Singh Procedure Date: 06/21/2024 10:13 AM MRN: [id number] Account #: [id number] Date of Birth: August 26, 1956 Admit Type: Outpatient Age: 68 Room: [facility] ENDO ROOM 4 Gender: Male Note Status: Finalized Instrument Name: Colon Scope [id number] Procedure:             Colonoscopy Indications:           High risk colon cancer surveillance: Personal history                         of colonic polyps Providers:             Rogelia Copping MD, MD Referring MD:          No Local Md, MD (Referring MD) Medicines:             Propofol  per Anesthesia Complications:         No immediate complications. Procedure:             Pre-Anesthesia Assessment:                        - Prior to the procedure, a History and Physical was                         performed, and patient medications and allergies were                         reviewed. The patient's tolerance of previous                         anesthesia was also reviewed. The risks and benefits                         of the procedure and the sedation options and risks                         were discussed with the patient. All questions were                         answered, and informed consent was obtained. Prior                         Anticoagulants: The patient has taken no anticoagulant                         or antiplatelet agents. ASA Grade Assessment: II - A                         patient with mild systemic disease. After reviewing                         the risks and benefits, the patient was deemed in                         satisfactory condition to undergo the procedure.                        After obtaining informed consent, the colonoscope was  passed under direct vision. Throughout the procedure,                         the patient's blood pressure, pulse, and oxygen                         saturations were monitored continuously. The                          Colonoscope was introduced through the anus and                         advanced to the the cecum, identified by appendiceal                         orifice and ileocecal valve. The colonoscopy was                         performed without difficulty. The patient tolerated                         the procedure well. The quality of the bowel                         preparation was excellent. Findings:      The perianal and digital rectal examinations were normal.      The colon (entire examined portion) appeared normal. Impression:            - The entire examined colon is normal.                        - No specimens collected. Recommendation:        - Discharge patient to home.                        - Resume previous diet.                        - Continue present medications.                        - Repeat colonoscopy in 7 years for surveillance. Procedure Code(s):     --- Professional ---                        [phone], Colonoscopy, flexible; diagnostic, including                         collection of specimen(s) by brushing or washing, when                         performed (separate procedure) Diagnosis Code(s):     --- Professional ---                        Z86.010, Personal history of colonic polyps CPT copyright 2022 [employer]. All rights reserved. The codes documented in this report are preliminary and upon coder review may  be revised to meet current compliance requirements. Rogelia Copping MD, MD 06/21/2024 10:38:47 AM This report has been signed electronically. Number of  Addenda: 0 Note Initiated On: 06/21/2024 10:13 AM Scope Withdrawal Time: 0 hours 8 minutes 37 seconds  Total Procedure Duration: 0 hours 17 minutes 6 seconds  Estimated Blood Loss:  Estimated blood loss: none.      [facility]
